# Patient Record
Sex: Female | Born: 2000 | Race: Black or African American | Hispanic: No | Marital: Single | Smoking: Never smoker
Health system: Southern US, Community
[De-identification: ages and names within clinical notes are randomized; demographics above are authoritative.]

## PROBLEM LIST (undated history)

## (undated) DIAGNOSIS — Z8489 Family history of other specified conditions: Secondary | ICD-10-CM

---
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
## 2018-06-10 ENCOUNTER — Other Ambulatory Visit: Payer: Self-pay | Admitting: Advanced Practice Midwife

## 2018-06-10 DIAGNOSIS — N6310 Unspecified lump in the right breast, unspecified quadrant: Secondary | ICD-10-CM

## 2018-06-21 ENCOUNTER — Other Ambulatory Visit: Payer: Self-pay | Admitting: Advanced Practice Midwife

## 2018-06-21 ENCOUNTER — Ambulatory Visit
Admission: RE | Admit: 2018-06-21 | Discharge: 2018-06-21 | Disposition: A | Discharge status: Home / Self Care | Payer: 59 | Source: Ambulatory Visit | Attending: Advanced Practice Midwife | Admitting: Advanced Practice Midwife

## 2018-06-21 DIAGNOSIS — N6310 Unspecified lump in the right breast, unspecified quadrant: Secondary | ICD-10-CM

## 2018-12-22 ENCOUNTER — Other Ambulatory Visit: Payer: 59

## 2019-03-29 ENCOUNTER — Ambulatory Visit
Admission: RE | Admit: 2019-03-29 | Discharge: 2019-03-29 | Disposition: A | Discharge status: Home / Self Care | Payer: BC Managed Care – PPO | Source: Ambulatory Visit | Attending: Advanced Practice Midwife | Admitting: Advanced Practice Midwife

## 2019-03-29 ENCOUNTER — Other Ambulatory Visit: Payer: Self-pay

## 2019-03-29 ENCOUNTER — Other Ambulatory Visit: Payer: Self-pay | Admitting: Advanced Practice Midwife

## 2019-03-29 DIAGNOSIS — N6310 Unspecified lump in the right breast, unspecified quadrant: Secondary | ICD-10-CM

## 2019-03-31 ENCOUNTER — Other Ambulatory Visit: Payer: Self-pay | Admitting: Advanced Practice Midwife

## 2019-03-31 ENCOUNTER — Ambulatory Visit
Admission: RE | Admit: 2019-03-31 | Discharge: 2019-03-31 | Disposition: A | Discharge status: Home / Self Care | Payer: BC Managed Care – PPO | Source: Ambulatory Visit | Attending: Advanced Practice Midwife | Admitting: Advanced Practice Midwife

## 2019-03-31 ENCOUNTER — Other Ambulatory Visit: Payer: Self-pay

## 2019-03-31 DIAGNOSIS — N6310 Unspecified lump in the right breast, unspecified quadrant: Secondary | ICD-10-CM

## 2019-04-11 ENCOUNTER — Ambulatory Visit: Payer: Self-pay | Admitting: Surgery

## 2019-04-11 DIAGNOSIS — N6310 Unspecified lump in the right breast, unspecified quadrant: Secondary | ICD-10-CM

## 2019-04-11 NOTE — H&P (Signed)
Mary Stout Documented: 04/11/2019 10:13 AM Location: [facility] Patient #: [id number] DOB: 24-Dec-2000 Single / Language: Cleophus Molt / Race: Black or African American Female  History of Present Illness Marcello Moores A. Kachina Niederer MD; 04/11/2019 10:32 AM) Patient words: Patient sent at the request of Dr. Dorise Bullion for enlarging right breast mass. The patient has been followed with ultrasound for a right breast mass o'clock. This had a large of her 6 month window and biopsy was performed. This showed a benign lesion but had areas of necrosis and given significant change in size over 6 months, she was referred for lumpectomy consideration. The patient denies breast pain, nipple discharge or significant change. She has no family history of breast disease             CLINICAL DATA: Six-month follow-up of right breast masses.  EXAM: ULTRASOUND OF THE RIGHT BREAST  COMPARISON: Previous exam(s).  FINDINGS: On physical exam, multiple lumps are again identified.  Targeted ultrasound is performed, showing a mass at 3 o'clock, 3 cm from the nipple measuring 3.6 x 3.4 x 4.2 cm today versus 2.8 x 2.0 x 2.9 cm previously. An adjacent mass at 3 o'clock, 3 cm from the nipple measures 8 by 5 x 5 mm today versus 10 by 4 x 5 mm previously, not significantly changed. A final mass at 3 o'clock, 3 cm from the nipple measures 8 by 6 x 5 mm today versus 7 by 7 x 4 mm previously, not significantly changed. No axillary adenopathy.  IMPRESSION: The largest mass is grown. The other 2 masses are stable and probably benign.  RECOMMENDATION: Recommend biopsy of the largest growing mass in the right breast.  I have discussed the findings and recommendations with the patient. Results were also provided in writing at the conclusion of the visit. If applicable, a reminder letter will be sent to the patient regarding the next appointment.  BI-RADS CATEGORY 4:  Suspicious.         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 SEE NOTE - NEGATIVE FOR MALIGNANCY.  The patient is a 18 year old female.   Past Surgical History Sabino Gasser, [place]; 04/11/2019 10:13 AM) No pertinent past surgical history  Diagnostic Studies History Sabino Gasser, [place]; 04/11/2019 10:13 AM) Colonoscopy never Mammogram never Pap Smear 1-5 years ago  Allergies Sabino Gasser, CMA; 04/11/2019 10:14 AM) No Known Drug Allergies [04/11/2019]: Allergies Reconciled  Medication History Sabino Gasser, CMA; 04/11/2019 10:14 AM) No Current Medications Medications Reconciled  Social History Sabino Gasser, [place]; 04/11/2019 10:13 AM) Alcohol use Occasional alcohol use. Caffeine use Coffee.  Pregnancy / Birth History Sabino Gasser, [place]; 04/11/2019 10:13 AM) Age at menarche 22 years. Regular periods  Other Problems Sabino Gasser, CMA; 04/11/2019 10:13 AM) No pertinent past medical history     Review of Systems Sabino Gasser CMA; 04/11/2019 10:13 AM) General Not Present- Appetite Loss, Chills, Fatigue, Fever, Night Sweats, Weight Gain and Weight Loss. HEENT Not Present- Earache, Hearing Loss, Hoarseness, Nose Bleed, Oral Ulcers, Ringing in the Ears, Seasonal Allergies, Sinus Pain, Sore Throat, Visual Disturbances, Wears glasses/contact lenses and Yellow Eyes. Respiratory Not Present- Bloody sputum, Chronic Cough, Difficulty Breathing, Snoring and Wheezing. Female Genitourinary Not Present- Frequency, Nocturia, Painful Urination, Pelvic Pain and Urgency. Musculoskeletal Not Present- Back Pain, Joint Pain, Joint Stiffness, Muscle Pain, Muscle Weakness and Swelling of Extremities. Neurological Not Present- Decreased Memory, Fainting, Headaches, Numbness, Seizures, Tingling, Tremor, Trouble walking  and Weakness.  Vitals Sabino Gasser CMA; 04/11/2019 10:14  AM) 04/11/2019 10:14 AM Weight: 174.38 lb Height: 66in Body Surface Area: 1.89 m Body Mass Index: 28.14 kg/m  Temp.: 98.36F(Oral)  Pulse: 100 (Regular)  BP: 112/62 (Sitting, Left Arm, Standard)        Physical Exam (Gertha Lichtenberg A. Green Quincy MD; 04/11/2019 10:32 AM)  General Mental Status-Alert. General Appearance-Consistent with stated age. Hydration-Well hydrated. Voice-Normal.  Head and Neck Head-normocephalic, atraumatic with no lesions or palpable masses. Trachea-midline. Thyroid Gland Characteristics - normal size and consistency.  Chest and Lung Exam Chest and lung exam reveals -quiet, even and easy respiratory effort with no use of accessory muscles and on auscultation, normal breath sounds, no adventitious sounds and normal vocal resonance. Inspection Chest Wall - Normal. Back - normal.  Breast Note: Right breast at 3:00 is a 3 cm mobile rubbery mass. Breasts are both very dense. No other mass is palpable.  Cardiovascular Cardiovascular examination reveals -normal heart sounds, regular rate and rhythm with no murmurs and normal pedal pulses bilaterally.  Neurologic Neurologic evaluation reveals -alert and oriented x 3 with no impairment of recent or remote memory. Mental Status-Normal.  Lymphatic Axillary  General Axillary Region: Bilateral - Description - Normal. Tenderness - Non Tender.    Assessment & Plan (Jefferey Lippmann A. Marabelle Cushman MD; 04/11/2019 10:33 AM)  LARGE MASS OF RIGHT BREAST (N63.10) Impression: Given significant change over 6 months, I recommend right breast lumpectomy. Risks, benefits and other treatment options discussed. Potential cosmetic issues discussed. Nonoperative options discussed. Risk of lumpectomy include bleeding, infection, seroma, more surgery, use of seed/wire, wound care, cosmetic deformity and the need for other treatments, death , blood clots, death. Pt agrees to proceed. COVID SCREENING RISK  DISCUSSED  Current Plans Pt Education - [facility] Breast Biopsy HCI: discussed with patient and provided information. You are being scheduled for surgery- Our schedulers will call you.  You should hear from our office's scheduling department within 5 working days about the location, date, and time of surgery. We try to make accommodations for patient's preferences in scheduling surgery, but sometimes the OR schedule or the surgeon's schedule prevents [place] from making those accommodations.  If you have not heard from our office [phone]) in 5 working days, call the office and ask for your surgeon's nurse.  If you have other questions about your diagnosis, plan, or surgery, call the office and ask for your surgeon's nurse.  Pt Education - Pamphlet Given - Breast Biopsy: discussed with patient and provided information.

## 2019-05-04 ENCOUNTER — Other Ambulatory Visit: Payer: Self-pay | Admitting: Surgery

## 2019-05-04 DIAGNOSIS — N6310 Unspecified lump in the right breast, unspecified quadrant: Secondary | ICD-10-CM

## 2019-05-26 ENCOUNTER — Other Ambulatory Visit: Payer: Self-pay

## 2019-05-26 ENCOUNTER — Encounter (HOSPITAL_BASED_OUTPATIENT_CLINIC_OR_DEPARTMENT_OTHER): Payer: Self-pay

## 2019-05-30 ENCOUNTER — Other Ambulatory Visit (HOSPITAL_COMMUNITY)
Admission: RE | Admit: 2019-05-30 | Discharge: 2019-05-30 | Disposition: A | Discharge status: Home / Self Care | Payer: BC Managed Care – PPO | Source: Ambulatory Visit | Attending: Surgery | Admitting: Surgery

## 2019-05-30 DIAGNOSIS — Z01812 Encounter for preprocedural laboratory examination: Secondary | ICD-10-CM | POA: Diagnosis present

## 2019-05-30 DIAGNOSIS — Z20828 Contact with and (suspected) exposure to other viral communicable diseases: Secondary | ICD-10-CM | POA: Diagnosis not present

## 2019-05-30 LAB — SARS CORONAVIRUS 2 (TAT 6-24 HRS): SARS Coronavirus 2: NEGATIVE

## 2019-06-01 ENCOUNTER — Other Ambulatory Visit: Payer: Self-pay

## 2019-06-01 ENCOUNTER — Ambulatory Visit
Admission: RE | Admit: 2019-06-01 | Discharge: 2019-06-01 | Disposition: A | Discharge status: Home / Self Care | Payer: BC Managed Care – PPO | Source: Ambulatory Visit | Attending: Surgery | Admitting: Surgery

## 2019-06-01 DIAGNOSIS — N6310 Unspecified lump in the right breast, unspecified quadrant: Secondary | ICD-10-CM

## 2019-06-02 ENCOUNTER — Encounter (HOSPITAL_BASED_OUTPATIENT_CLINIC_OR_DEPARTMENT_OTHER)
Admission: RE | Disposition: A | Discharge status: Home / Self Care | Payer: Self-pay | Source: Home / Self Care | Attending: Surgery

## 2019-06-02 ENCOUNTER — Encounter (HOSPITAL_BASED_OUTPATIENT_CLINIC_OR_DEPARTMENT_OTHER): Payer: Self-pay | Admitting: *Deleted

## 2019-06-02 ENCOUNTER — Other Ambulatory Visit: Payer: Self-pay

## 2019-06-02 ENCOUNTER — Ambulatory Visit (HOSPITAL_BASED_OUTPATIENT_CLINIC_OR_DEPARTMENT_OTHER)
Admission: RE | Admit: 2019-06-02 | Discharge: 2019-06-02 | Disposition: A | Discharge status: Home / Self Care | Payer: BC Managed Care – PPO | Attending: Surgery | Admitting: Surgery

## 2019-06-02 ENCOUNTER — Ambulatory Visit
Admission: RE | Admit: 2019-06-02 | Discharge: 2019-06-02 | Disposition: A | Discharge status: Home / Self Care | Payer: BC Managed Care – PPO | Source: Ambulatory Visit | Attending: Surgery | Admitting: Surgery

## 2019-06-02 ENCOUNTER — Ambulatory Visit (HOSPITAL_BASED_OUTPATIENT_CLINIC_OR_DEPARTMENT_OTHER): Payer: BC Managed Care – PPO | Admitting: Certified Registered"

## 2019-06-02 DIAGNOSIS — N6310 Unspecified lump in the right breast, unspecified quadrant: Secondary | ICD-10-CM | POA: Diagnosis present

## 2019-06-02 DIAGNOSIS — D241 Benign neoplasm of right breast: Secondary | ICD-10-CM | POA: Diagnosis not present

## 2019-06-02 HISTORY — DX: Family history of other specified conditions: Z84.89

## 2019-06-02 HISTORY — PX: BREAST LUMPECTOMY WITH RADIOACTIVE SEED LOCALIZATION: SHX6424

## 2019-06-02 LAB — POCT PREGNANCY, URINE: Preg Test, Ur: NEGATIVE

## 2019-06-02 SURGERY — BREAST LUMPECTOMY WITH RADIOACTIVE SEED LOCALIZATION
Anesthesia: General | Site: Breast | Laterality: Right

## 2019-06-02 MED ORDER — ONDANSETRON HCL 4 MG/2ML IJ SOLN: 4.0000 mg | Freq: Once | INTRAMUSCULAR | Status: DC | PRN

## 2019-06-02 MED ORDER — BUPIVACAINE-EPINEPHRINE (PF) 0.25% -1:200000 IJ SOLN
INTRAMUSCULAR | Status: DC | PRN
  Administered 2019-06-02: 20 mL

## 2019-06-02 MED ORDER — LIDOCAINE 2% (20 MG/ML) 5 ML SYRINGE
INTRAMUSCULAR | Status: DC | PRN
  Administered 2019-06-02: 80 mg via INTRAVENOUS

## 2019-06-02 MED ORDER — OXYCODONE HCL 5 MG/5ML PO SOLN: 5.0000 mg | Freq: Once | ORAL | Status: DC | PRN

## 2019-06-02 MED ORDER — CEFAZOLIN SODIUM-DEXTROSE 2-4 GM/100ML-% IV SOLN
2.0000 g | INTRAVENOUS | Status: AC
  Administered 2019-06-02: 2 g via INTRAVENOUS

## 2019-06-02 MED ORDER — HYDROMORPHONE HCL 1 MG/ML IJ SOLN: 0.2500 mg | INTRAMUSCULAR | Status: DC | PRN

## 2019-06-02 MED ORDER — CHLORHEXIDINE GLUCONATE CLOTH 2 % EX PADS: 6.0000 | MEDICATED_PAD | Freq: Once | CUTANEOUS | Status: DC

## 2019-06-02 MED ORDER — GABAPENTIN 300 MG PO CAPS
ORAL_CAPSULE | ORAL | Status: AC
  Filled 2019-06-02: qty 1

## 2019-06-02 MED ORDER — DEXAMETHASONE SODIUM PHOSPHATE 10 MG/ML IJ SOLN
INTRAMUSCULAR | Status: DC | PRN
  Administered 2019-06-02: 4 mg via INTRAVENOUS

## 2019-06-02 MED ORDER — MIDAZOLAM HCL 2 MG/2ML IJ SOLN
1.0000 mg | INTRAMUSCULAR | Status: DC | PRN
  Administered 2019-06-02: 2 mg via INTRAVENOUS

## 2019-06-02 MED ORDER — PROPOFOL 10 MG/ML IV BOLUS
INTRAVENOUS | Status: AC
  Filled 2019-06-02: qty 20

## 2019-06-02 MED ORDER — OXYCODONE HCL 5 MG PO TABS: 5.0000 mg | ORAL_TABLET | Freq: Once | ORAL | Status: DC | PRN

## 2019-06-02 MED ORDER — ACETAMINOPHEN 500 MG PO TABS
ORAL_TABLET | ORAL | Status: AC
  Filled 2019-06-02: qty 2

## 2019-06-02 MED ORDER — SCOPOLAMINE 1 MG/3DAYS TD PT72: 1.0000 | MEDICATED_PATCH | Freq: Once | TRANSDERMAL | Status: DC

## 2019-06-02 MED ORDER — CEFAZOLIN SODIUM-DEXTROSE 2-4 GM/100ML-% IV SOLN
INTRAVENOUS | Status: AC
  Filled 2019-06-02: qty 100

## 2019-06-02 MED ORDER — CELECOXIB 200 MG PO CAPS
ORAL_CAPSULE | ORAL | Status: AC
  Filled 2019-06-02: qty 1

## 2019-06-02 MED ORDER — ONDANSETRON HCL 4 MG/2ML IJ SOLN
INTRAMUSCULAR | Status: DC | PRN
  Administered 2019-06-02: 4 mg via INTRAVENOUS

## 2019-06-02 MED ORDER — FENTANYL CITRATE (PF) 100 MCG/2ML IJ SOLN
INTRAMUSCULAR | Status: AC
  Filled 2019-06-02: qty 2

## 2019-06-02 MED ORDER — EPHEDRINE SULFATE 50 MG/ML IJ SOLN
INTRAMUSCULAR | Status: DC | PRN
  Administered 2019-06-02: 5 mg via INTRAVENOUS

## 2019-06-02 MED ORDER — FENTANYL CITRATE (PF) 100 MCG/2ML IJ SOLN
50.0000 ug | INTRAMUSCULAR | Status: AC | PRN
  Administered 2019-06-02 (×4): 25 ug via INTRAVENOUS

## 2019-06-02 MED ORDER — ACETAMINOPHEN 500 MG PO TABS
1000.0000 mg | ORAL_TABLET | ORAL | Status: AC
  Administered 2019-06-02: 07:00:00 1000 mg via ORAL

## 2019-06-02 MED ORDER — LACTATED RINGERS IV SOLN
INTRAVENOUS | Status: DC
  Administered 2019-06-02 (×2): via INTRAVENOUS

## 2019-06-02 MED ORDER — MEPERIDINE HCL 25 MG/ML IJ SOLN: 6.2500 mg | INTRAMUSCULAR | Status: DC | PRN

## 2019-06-02 MED ORDER — MIDAZOLAM HCL 2 MG/2ML IJ SOLN
INTRAMUSCULAR | Status: AC
  Filled 2019-06-02: qty 2

## 2019-06-02 MED ORDER — PROPOFOL 10 MG/ML IV BOLUS
INTRAVENOUS | Status: DC | PRN
  Administered 2019-06-02: 200 mg via INTRAVENOUS
  Administered 2019-06-02: 50 mg via INTRAVENOUS
  Administered 2019-06-02: 30 mg via INTRAVENOUS

## 2019-06-02 MED ORDER — IBUPROFEN 800 MG PO TABS: 800.0000 mg | ORAL_TABLET | Freq: Three times a day (TID) | ORAL | 0 refills | Status: AC | PRN

## 2019-06-02 MED ORDER — GABAPENTIN 300 MG PO CAPS
300.0000 mg | ORAL_CAPSULE | ORAL | Status: AC
  Administered 2019-06-02: 300 mg via ORAL

## 2019-06-02 MED ORDER — CELECOXIB 200 MG PO CAPS
200.0000 mg | ORAL_CAPSULE | ORAL | Status: AC
  Administered 2019-06-02: 200 mg via ORAL

## 2019-06-02 MED ORDER — HYDROCODONE-ACETAMINOPHEN 5-325 MG PO TABS: 1.0000 | ORAL_TABLET | Freq: Four times a day (QID) | ORAL | 0 refills | Status: AC | PRN

## 2019-06-02 SURGICAL SUPPLY — 46 items
APPLIER CLIP 9.375 MED OPEN (MISCELLANEOUS)
BINDER BREAST LRG (GAUZE/BANDAGES/DRESSINGS) ×3 IMPLANT
BINDER BREAST XLRG (GAUZE/BANDAGES/DRESSINGS) IMPLANT
BLADE SURG 15 STRL LF DISP TIS (BLADE) ×1 IMPLANT
BLADE SURG 15 STRL SS (BLADE) ×2
CANISTER SUC SOCK COL 7IN (MISCELLANEOUS) IMPLANT
CANISTER SUCT 1200ML W/VALVE (MISCELLANEOUS) IMPLANT
CHLORAPREP W/TINT 26 (MISCELLANEOUS) ×3 IMPLANT
CLIP APPLIE 9.375 MED OPEN (MISCELLANEOUS) IMPLANT
COVER BACK TABLE REUSABLE LG (DRAPES) ×3 IMPLANT
COVER MAYO STAND REUSABLE (DRAPES) ×3 IMPLANT
COVER PROBE W GEL 5X96 (DRAPES) ×3 IMPLANT
DECANTER SPIKE VIAL GLASS SM (MISCELLANEOUS) IMPLANT
DERMABOND ADVANCED (GAUZE/BANDAGES/DRESSINGS) ×2
DERMABOND ADVANCED .7 DNX12 (GAUZE/BANDAGES/DRESSINGS) ×1 IMPLANT
DRAPE LAPAROTOMY 100X72 PEDS (DRAPES) ×3 IMPLANT
DRAPE UTILITY XL STRL (DRAPES) ×3 IMPLANT
ELECT COATED BLADE 2.86 ST (ELECTRODE) ×3 IMPLANT
ELECT REM PT RETURN 9FT ADLT (ELECTROSURGICAL) ×3
ELECTRODE REM PT RTRN 9FT ADLT (ELECTROSURGICAL) ×1 IMPLANT
GLOVE BIO SURGEON STRL SZ7 (GLOVE) ×3 IMPLANT
GLOVE BIOGEL PI IND STRL 7.0 (GLOVE) ×2 IMPLANT
GLOVE BIOGEL PI IND STRL 8 (GLOVE) ×1 IMPLANT
GLOVE BIOGEL PI INDICATOR 7.0 (GLOVE) ×4
GLOVE BIOGEL PI INDICATOR 8 (GLOVE) ×2
GLOVE ECLIPSE 8.0 STRL XLNG CF (GLOVE) ×3 IMPLANT
GOWN STRL REUS W/ TWL LRG LVL3 (GOWN DISPOSABLE) ×2 IMPLANT
GOWN STRL REUS W/TWL LRG LVL3 (GOWN DISPOSABLE) ×4
HEMOSTAT ARISTA ABSORB 3G PWDR (HEMOSTASIS) IMPLANT
HEMOSTAT SNOW SURGICEL 2X4 (HEMOSTASIS) IMPLANT
KIT MARKER MARGIN INK (KITS) ×3 IMPLANT
NEEDLE HYPO 25X1 1.5 SAFETY (NEEDLE) ×3 IMPLANT
NS IRRIG 1000ML POUR BTL (IV SOLUTION) ×3 IMPLANT
PACK BASIN DAY SURGERY FS (CUSTOM PROCEDURE TRAY) ×3 IMPLANT
PENCIL BUTTON HOLSTER BLD 10FT (ELECTRODE) ×3 IMPLANT
SLEEVE SCD COMPRESS KNEE MED (MISCELLANEOUS) ×3 IMPLANT
SPONGE LAP 4X18 RFD (DISPOSABLE) ×3 IMPLANT
SUT MNCRL AB 4-0 PS2 18 (SUTURE) ×3 IMPLANT
SUT SILK 2 0 SH (SUTURE) IMPLANT
SUT VICRYL 3-0 CR8 SH (SUTURE) ×3 IMPLANT
SYR CONTROL 10ML LL (SYRINGE) ×3 IMPLANT
TOWEL GREEN STERILE FF (TOWEL DISPOSABLE) ×3 IMPLANT
TRAY FAXITRON CT DISP (TRAY / TRAY PROCEDURE) ×3 IMPLANT
TUBE CONNECTING 20'X1/4 (TUBING)
TUBE CONNECTING 20X1/4 (TUBING) IMPLANT
YANKAUER SUCT BULB TIP NO VENT (SUCTIONS) IMPLANT

## 2019-06-02 NOTE — Anesthesia Preprocedure Evaluation (Signed)
Anesthesia Evaluation  Patient identified by MRN, date of birth, ID band Patient awake    Reviewed: Allergy & Precautions, NPO status , Patient's Chart, lab work & pertinent test results  History of Anesthesia Complications (+) Family history of anesthesia reaction  Airway Mallampati: II  TM Distance: >3 FB Neck ROM: Full    Dental no notable dental hx. (+) Teeth Intact   Pulmonary neg pulmonary ROS,    Pulmonary exam normal breath sounds clear to auscultation       Cardiovascular negative cardio ROS Normal cardiovascular exam Rhythm:Regular Rate:Normal     Neuro/Psych negative neurological ROS  negative psych ROS   GI/Hepatic negative GI ROS, Neg liver ROS,   Endo/Other  Right Breast Mass  Renal/GU negative Renal ROS  negative genitourinary   Musculoskeletal negative musculoskeletal ROS (+)   Abdominal   Peds  Hematology negative hematology ROS (+)   Anesthesia Other Findings   Reproductive/Obstetrics                             Anesthesia Physical Anesthesia Plan  ASA: II  Anesthesia Plan: General   Post-op Pain Management:    Induction: Intravenous  PONV Risk Score and Plan: 3 and Ondansetron, Dexamethasone, Scopolamine patch - Pre-op, Midazolam and Treatment may vary due to age or medical condition  Airway Management Planned: LMA  Additional Equipment:   Intra-op Plan:   Post-operative Plan: Extubation in OR  Informed Consent: I have reviewed the patients History and Physical, chart, labs and discussed the procedure including the risks, benefits and alternatives for the proposed anesthesia with the patient or authorized representative who has indicated his/her understanding and acceptance.     Dental advisory given  Plan Discussed with: CRNA and Surgeon  Anesthesia Plan Comments:         Anesthesia Quick Evaluation

## 2019-06-02 NOTE — Discharge Instructions (Signed)
[facility] °Office Phone Number [phone] ° °BREAST BIOPSY/ PARTIAL MASTECTOMY: POST OP INSTRUCTIONS ° °Always review your discharge instruction sheet given to you by the facility where your surgery was performed. ° °IF YOU HAVE DISABILITY OR FAMILY LEAVE FORMS, YOU MUST BRING THEM TO THE OFFICE FOR PROCESSING.  DO NOT GIVE THEM TO YOUR DOCTOR. ° °1. A prescription for pain medication may be given to you upon discharge.  Take your pain medication as prescribed, if needed.  If narcotic pain medicine is not needed, then you may take acetaminophen (Tylenol) or ibuprofen (Advil) as needed. °2. Take your usually prescribed medications unless otherwise directed °3. If you need a refill on your pain medication, please contact your pharmacy.  They will contact our office to request authorization.  Prescriptions will not be filled after 5pm or on week-ends. °4. You should eat very light the first 24 hours after surgery, such as soup, crackers, pudding, etc.  Resume your normal diet the day after surgery. °5. Most patients will experience some swelling and bruising in the breast.  Ice packs and a good support bra will help.  Swelling and bruising can take several days to resolve.  °6. It is common to experience some constipation if taking pain medication after surgery.  Increasing fluid intake and taking a stool softener will usually help or prevent this problem from occurring.  A mild laxative (Milk of Magnesia or Miralax) should be taken according to package directions if there are no bowel movements after 48 hours. °7. Unless discharge instructions indicate otherwise, you may remove your bandages 24-48 hours after surgery, and you may shower at that time.  You may have steri-strips (small skin tapes) in place directly over the incision.  These strips should be left on the skin for 7-10 days.  If your surgeon used skin glue on the incision, you may shower in 24 hours.  The glue will flake off over the  next 2-3 weeks.  Any sutures or staples will be removed at the office during your follow-up visit. °8. ACTIVITIES:  You may resume regular daily activities (gradually increasing) beginning the next day.  Wearing a good support bra or sports bra minimizes pain and swelling.  You may have sexual intercourse when it is comfortable. °a. You may drive when you no longer are taking prescription pain medication, you can comfortably wear a seatbelt, and you can safely maneuver your car and apply brakes. °b. RETURN TO WORK:  ______________________________________________________________________________________ °9. You should see your doctor in the office for a follow-up appointment approximately two weeks after your surgery.  Your doctor’s nurse will typically make your follow-up appointment when she calls you with your pathology report.  Expect your pathology report 2-3 business days after your surgery.  You may call to check if you do not hear from us after three days. °10. OTHER INSTRUCTIONS: _______________________________________________________________________________________________ _____________________________________________________________________________________________________________________________________ °_____________________________________________________________________________________________________________________________________ °_____________________________________________________________________________________________________________________________________ ° °WHEN TO CALL YOUR DOCTOR: °1. Fever over 101.0 °2. Nausea and/or vomiting. °3. Extreme swelling or bruising. °4. Continued bleeding from incision. °5. Increased pain, redness, or drainage from the incision. ° °The clinic staff is available to answer your questions during regular business hours.  Please don’t hesitate to call and ask to speak to one of the nurses for clinical concerns.  If you have a medical emergency, go to the nearest  emergency room or call 911.  A surgeon from [facility] is always on call at the hospital. ° °For further questions, please visit [email or web address]  ° ° ° °  May take next dose of Tylenol at 1:30 PM on 06/02/2019 if needed. May take next dose of Ibuprofen at 3:30 PM on 06/02/2019 if needed.   Post Anesthesia Home Care Instructions  Activity: Get plenty of rest for the remainder of the day. A responsible individual must stay with you for 24 hours following the procedure.  For the next 24 hours, DO NOT: -Drive a car -Paediatric nurse -Drink alcoholic beverages -Take any medication unless instructed by your physician -Make any legal decisions or sign important papers.  Meals: Start with liquid foods such as gelatin or soup. Progress to regular foods as tolerated. Avoid greasy, spicy, heavy foods. If nausea and/or vomiting occur, drink only clear liquids until the nausea and/or vomiting subsides. Call your physician if vomiting continues.  Special Instructions/Symptoms: Your throat may feel dry or sore from the anesthesia or the breathing tube placed in your throat during surgery. If this causes discomfort, gargle with warm salt water. The discomfort should disappear within 24 hours.  If you had a scopolamine patch placed behind your ear for the management of post- operative nausea and/or vomiting:  1. The medication in the patch is effective for 72 hours, after which it should be removed.  Wrap patch in a tissue and discard in the trash. Wash hands thoroughly with soap and water. 2. You may remove the patch earlier than 72 hours if you experience unpleasant side effects which may include dry mouth, dizziness or visual disturbances. 3. Avoid touching the patch. Wash your hands with soap and water after contact with the patch.

## 2019-06-02 NOTE — Anesthesia Postprocedure Evaluation (Signed)
Anesthesia Post Note  Patient: Mary Stout  Procedure(s) Performed: RIGHT BREAST LUMPECTOMY WITH RADIOACTIVE SEED LOCALIZATION (Right Breast)     Patient location during evaluation: PACU Anesthesia Type: General Level of consciousness: awake and alert and oriented Pain management: pain level controlled Vital Signs Assessment: post-procedure vital signs reviewed and stable Respiratory status: spontaneous breathing, nonlabored ventilation and respiratory function stable Cardiovascular status: blood pressure returned to baseline and stable Postop Assessment: no apparent nausea or vomiting Anesthetic complications: no    Last Vitals:  Vitals:   06/02/19 1015 06/02/19 1029  BP: 103/67 106/68  Pulse: 93 88  Resp: 19 20  Temp:  36.9 C  SpO2: 100% 98%    Last Pain:  Vitals:   06/02/19 1029  TempSrc:   PainSc: 1                  Cynithia Hakimi A.

## 2019-06-02 NOTE — Op Note (Signed)
Preoperative diagnosis: Right breast mass  Postoperative diagnosis: Same  Procedure: Right breast seed localized lumpectomy  Surgeon: Erroll Luna, MD  Anesthesia: LMA with local  EBL: Minimal  Specimen: Right breast mass with seed and clip verified by Faxitron  Drains: None  IV fluids: Per anesthesia record  Indications for procedure: The patient presents for right breast lumpectomy for enlarging right breast mass.  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  That was felt to be significant.  Recommend a right breast lumpectomy to remove this to exclude any malignant variant as well as prevent future increases in size especially if she were to get pregnant.The procedure has been discussed with the patient. Alternatives to surgery have been discussed with the patient.  Risks of surgery include bleeding,  Infection,  Seroma formation, death,  and the need for further surgery.   The patient understands and wishes to proceed.  Description of procedure: The patient was met in the holding area and questions were answered.  Neoprobe used to verify seed location in the right breast and this was marked as the correct side.  She was then taken back to the operative room placed supine upon the OR table.  After induction of general anesthesia, the right breast was prepped and draped in sterile fashion and timeout was performed.  Curvilinear incision made along the lateral border of the nipple areolar complex adjacent to the mass with the help of the neoprobe.  All tissue around the mass was excised with grossly negative margins.  Hemostasis achieved and Faxitron revealed the seed and clip to be in the specimen.  Wound was then closed with 3-0 Vicryl and 4-0 Monocryl.  Dermabond applied.  All final counts found to be correct of sponge, needles and instruments.  Patient was awoke extubated and sent to recovery in satisfactory condition.

## 2019-06-02 NOTE — H&P (Signed)
Mary Stout  Location: [facility] Patient #: [id number] DOB: 08-Feb-2001 Single / Language: Cleophus Molt / Race: Black or African American Female  History of Present Illness  Patient words: Patient sent at the request of Dr. Dorise Bullion for enlarging right breast mass. The patient has been followed with ultrasound . This had  enlarged over  6 month window and biopsy was performed. This showed a benign lesion but had areas of necrosis. Given significant change in size over 6 months, she was referred for lumpectomy consideration. The patient denies breast pain, nipple discharge or significant change. She has no family history of breast disease             CLINICAL DATA: Six-month follow-up of right breast masses.  EXAM: ULTRASOUND OF THE RIGHT BREAST  COMPARISON: Previous exam(s).  FINDINGS: On physical exam, multiple lumps are again identified.  Targeted ultrasound is performed, showing a mass at 3 o'clock, 3 cm from the nipple measuring 3.6 x 3.4 x 4.2 cm today versus 2.8 x 2.0 x 2.9 cm previously. An adjacent mass at 3 o'clock, 3 cm from the nipple measures 8 by 5 x 5 mm today versus 10 by 4 x 5 mm previously, not significantly changed. A final mass at 3 o'clock, 3 cm from the nipple measures 8 by 6 x 5 mm today versus 7 by 7 x 4 mm previously, not significantly changed. No axillary adenopathy.  IMPRESSION: The largest mass is grown. The other 2 masses are stable and probably benign.  RECOMMENDATION: Recommend biopsy of the largest growing mass in the right breast.  I have discussed the findings and recommendations with the patient. Results were also provided in writing at the conclusion of the visit. If applicable, a reminder letter will be sent to the patient regarding the next appointment.  BI-RADS CATEGORY 4: Suspicious.         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 SEE NOTE - NEGATIVE FOR MALIGNANCY.  The patient is a 18 year old female.   Past Surgical History  No pertinent past surgical history  Diagnostic Studies History  Colonoscopy never Mammogram never Pap Smear 1-5 years ago  Allergies  No Known Drug Allergies  Allergies Reconciled  Medication History  No Current Medications Medications Reconciled  Social History  Alcohol use Occasional alcohol use. Caffeine use Coffee.  Pregnancy / Birth History  Age at menarche 67 years. Regular periods  Other Problems  No pertinent past medical history     Review of Systems  General Not Present- Appetite Loss, Chills, Fatigue, Fever, Night Sweats, Weight Gain and Weight Loss. HEENT Not Present- Earache, Hearing Loss, Hoarseness, Nose Bleed, Oral Ulcers, Ringing in the Ears, Seasonal Allergies, Sinus Pain, Sore Throat, Visual Disturbances, Wears glasses/contact lenses and Yellow Eyes. Respiratory Not Present- Bloody sputum, Chronic Cough, Difficulty Breathing, Snoring and Wheezing. Female Genitourinary Not Present- Frequency, Nocturia, Painful Urination, Pelvic Pain and Urgency. Musculoskeletal Not Present- Back Pain, Joint Pain, Joint Stiffness, Muscle Pain, Muscle Weakness and Swelling of Extremities. Neurological Not Present- Decreased Memory, Fainting, Headaches, Numbness, Seizures, Tingling, Tremor, Trouble walking and Weakness.  Vitals  04/11/2019 10:14 AM Weight: 174.38 lb Height: 66in Body Surface Area: 1.89 m Body Mass Index: 28.14 kg/m  Temp.: 98.29F(Oral)  Pulse: 100 (Regular)  BP: 112/62 (Sitting, Left Arm, Standard)        Physical Exam   General Mental Status-Alert. General Appearance-Consistent with stated  age. Hydration-Well hydrated. Voice-Normal.  Head and Neck Head-normocephalic, atraumatic with no  lesions or palpable masses. Trachea-midline. Thyroid Gland Characteristics - normal size and consistency.  Chest and Lung Exam Chest and lung exam reveals -quiet, even and easy respiratory effort with no use of accessory muscles and on auscultation, normal breath sounds, no adventitious sounds and normal vocal resonance. Inspection Chest Wall - Normal. Back - normal.  Breast Note: Right breast at 3:00 is a 3 cm mobile rubbery mass. Breasts are both very dense. No other mass is palpable.  Cardiovascular Cardiovascular examination reveals -normal heart sounds, regular rate and rhythm with no murmurs and normal pedal pulses bilaterally.  Neurologic Neurologic evaluation reveals -alert and oriented x 3 with no impairment of recent or remote memory. Mental Status-Normal.  Lymphatic Axillary  General Axillary Region: Bilateral - Description - Normal. Tenderness - Non Tender.    Assessment & Plan   LARGE MASS OF RIGHT BREAST (N63.10) Impression: Given significant change over 6 months, I recommend right breast lumpectomy. Risks, benefits and other treatment options discussed. Potential cosmetic issues discussed. Nonoperative options discussed. Risk of lumpectomy include bleeding, infection, seroma, more surgery, use of seed/wire, wound care, cosmetic deformity and the need for other treatments, death , blood clots, death. Pt agrees to proceed. COVID SCREENING RISK DISCUSSED  Current Plans Pt Education - [facility] Breast Biopsy HCI: discussed with patient and provided information. You are being scheduled for surgery- Our schedulers will call you.  You should hear from our office's scheduling department within 5 working days about the location, date, and time of surgery. We try to make accommodations for patient's preferences in scheduling surgery, but sometimes the OR schedule or the surgeon's schedule prevents [place] from making those accommodations.  If you have not  heard from our office [phone]) in 5 working days, call the office and ask for your surgeon's nurse.  If you have other questions about your diagnosis, plan, or surgery, call the office and ask for your surgeon's nurse.  Pt Education - Pamphlet Given - Breast Biopsy: discussed with patient and provided information.

## 2019-06-02 NOTE — Interval H&P Note (Signed)
History and Physical Interval Note:  06/02/2019 8:25 AM  Mary Stout  has presented today for surgery, with the diagnosis of RIGHT BREAST MASS.  The various methods of treatment have been discussed with the patient and family. After consideration of risks, benefits and other options for treatment, the patient has consented to  Procedure(s): RIGHT BREAST LUMPECTOMY WITH RADIOACTIVE SEED LOCALIZATION (Right) as a surgical intervention.  The patient's history has been reviewed, patient examined, no change in status, stable for surgery.  I have reviewed the patient's chart and labs.  Questions were answered to the patient's satisfaction.     [place]

## 2019-06-02 NOTE — Transfer of Care (Signed)
Immediate Anesthesia Transfer of Care Note  Patient: Mary Stout  Procedure(s) Performed: RIGHT BREAST LUMPECTOMY WITH RADIOACTIVE SEED LOCALIZATION (Right Breast)  Patient Location: PACU  Anesthesia Type:General  Level of Consciousness: drowsy  Airway & Oxygen Therapy: Patient Spontanous Breathing and Patient connected to face mask oxygen  Post-op Assessment: Report given to RN and Post -op Vital signs reviewed and stable  Post vital signs: Reviewed and stable  Last Vitals:  Vitals Value Taken Time  BP 100/53 06/02/19 1000  Temp    Pulse 101 06/02/19 1001  Resp 16 06/02/19 1001  SpO2 99 % 06/02/19 1001  Vitals shown include unvalidated device data.  Last Pain:  Vitals:   06/02/19 0709  TempSrc: Oral  PainSc: 0-No pain         Complications: No apparent anesthesia complications

## 2019-06-02 NOTE — Anesthesia Procedure Notes (Signed)
Procedure Name: LMA Insertion Date/Time: 06/02/2019 8:53 AM Performed by: Lavonia Dana, CRNA Pre-anesthesia Checklist: Patient identified, Emergency Drugs available, Suction available and Patient being monitored Patient Re-evaluated:Patient Re-evaluated prior to induction Oxygen Delivery Method: Circle system utilized Preoxygenation: Pre-oxygenation with 100% oxygen Induction Type: IV induction Ventilation: Mask ventilation without difficulty LMA: LMA inserted LMA Size: 4.0 Number of attempts: 1 Airway Equipment and Method: Bite block Placement Confirmation: positive ETCO2 Tube secured with: Tape Dental Injury: Teeth and Oropharynx as per pre-operative assessment

## 2019-06-03 ENCOUNTER — Encounter (HOSPITAL_BASED_OUTPATIENT_CLINIC_OR_DEPARTMENT_OTHER): Payer: Self-pay | Admitting: Surgery

## 2019-06-06 LAB — SURGICAL PATHOLOGY

## 2019-06-25 IMAGING — US ULTRASOUND RIGHT BREAST LIMITED
1 series · 11 of 11 positions shown · non-contrast
Comparison: None.

CLINICAL DATA: 17-year-old female presenting for evaluation of a
palpable lump in the right breast.

EXAM:
ULTRASOUND OF THE RIGHT BREAST

[Series 1: ultrasound right breast limited · 0.07mm/px · 11 of 11 slices shown]
[im 1/11]
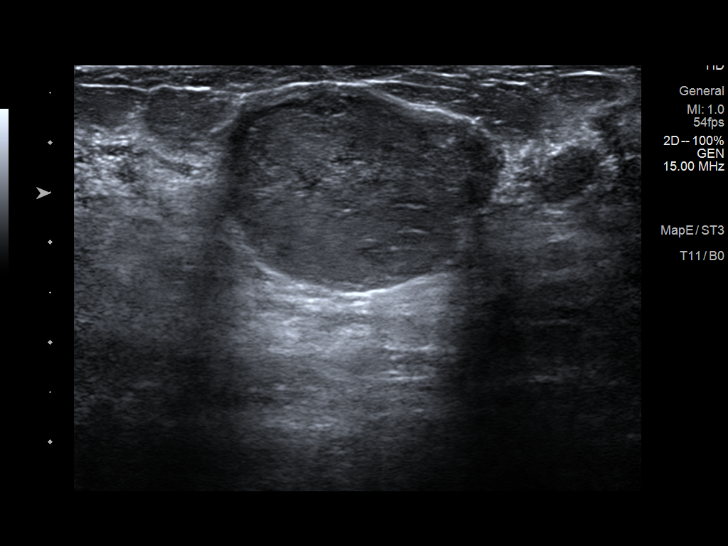
[im 2/11]
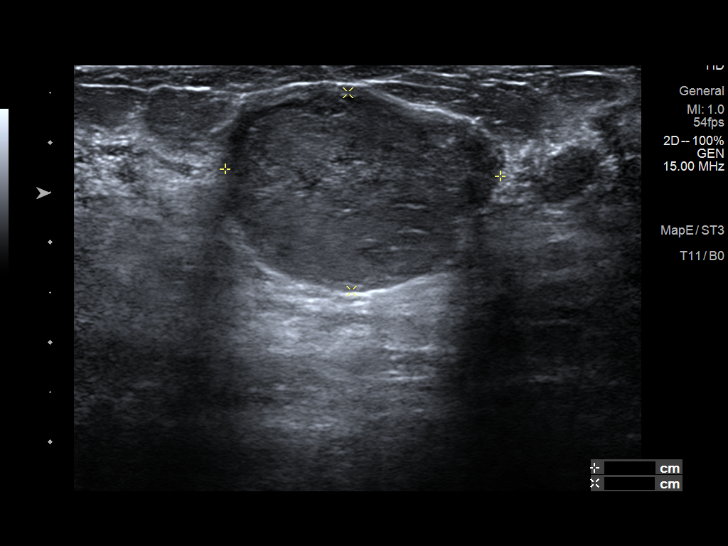
[im 3/11]
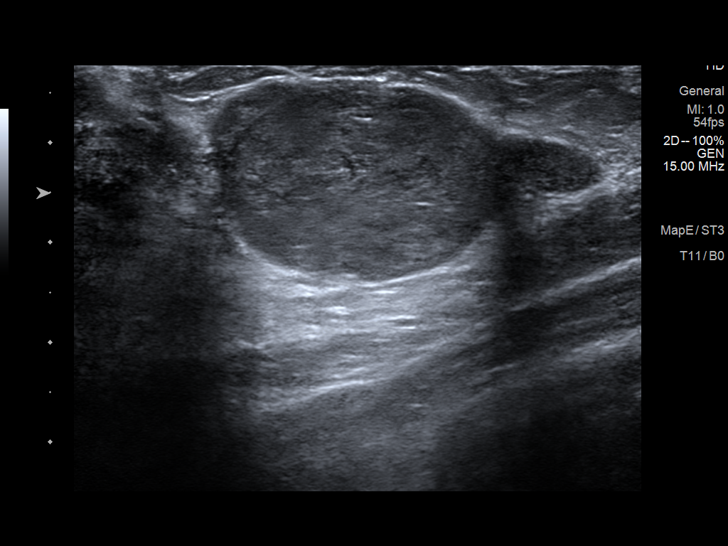
[im 4/11]
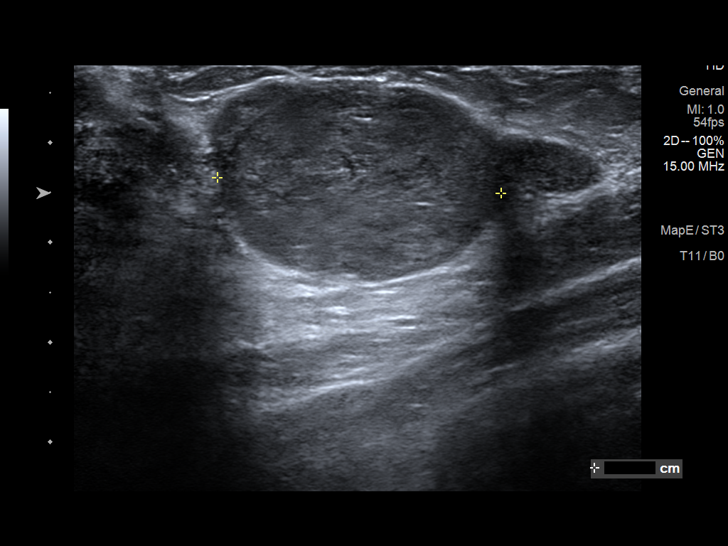
[im 5/11]
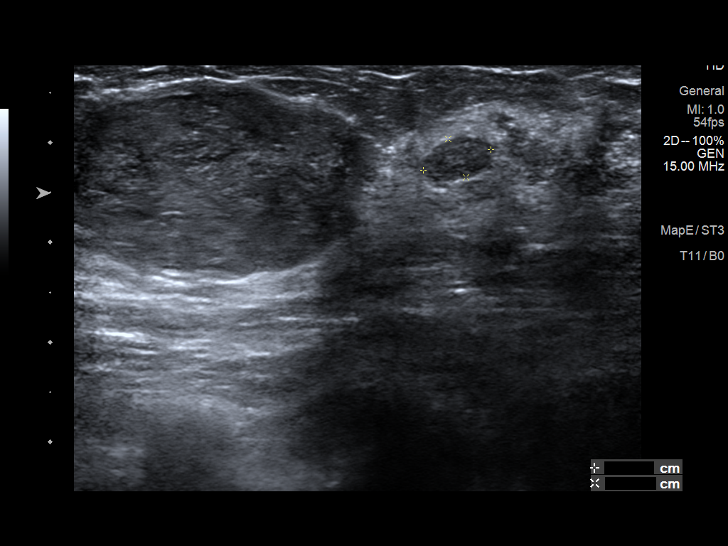
[im 6/11]
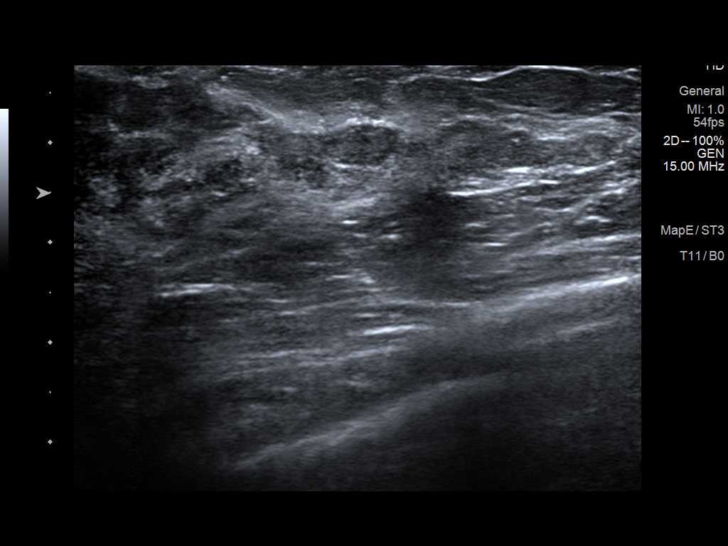
[im 7/11]
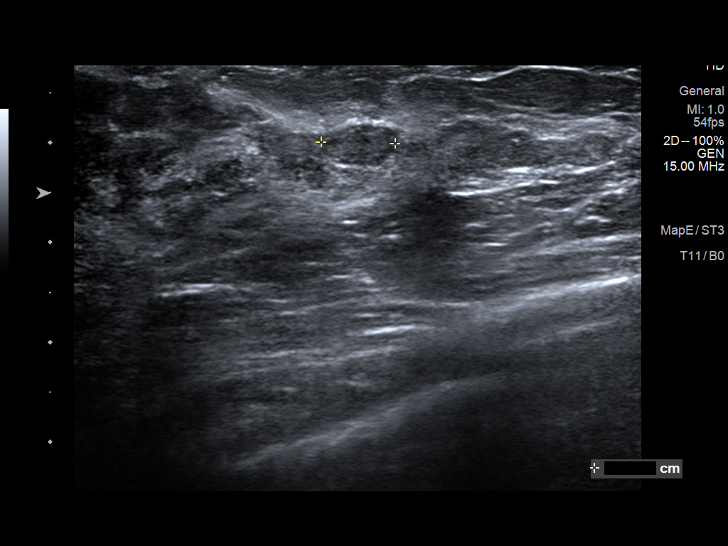
[im 8/11]
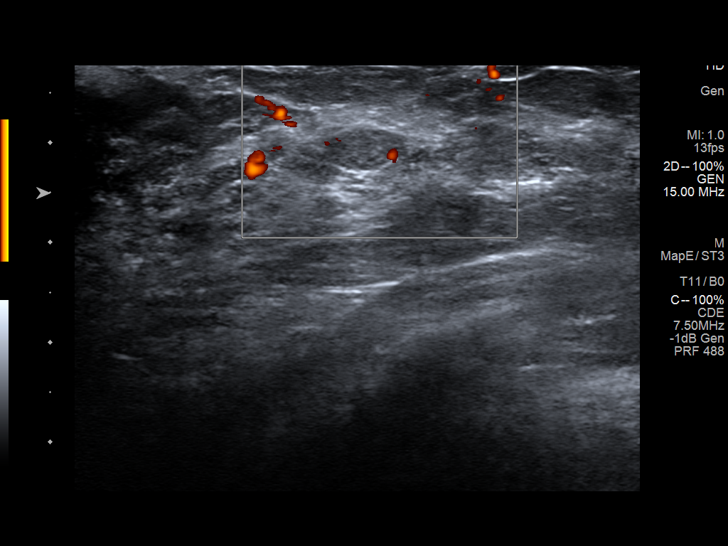
[im 9/11]
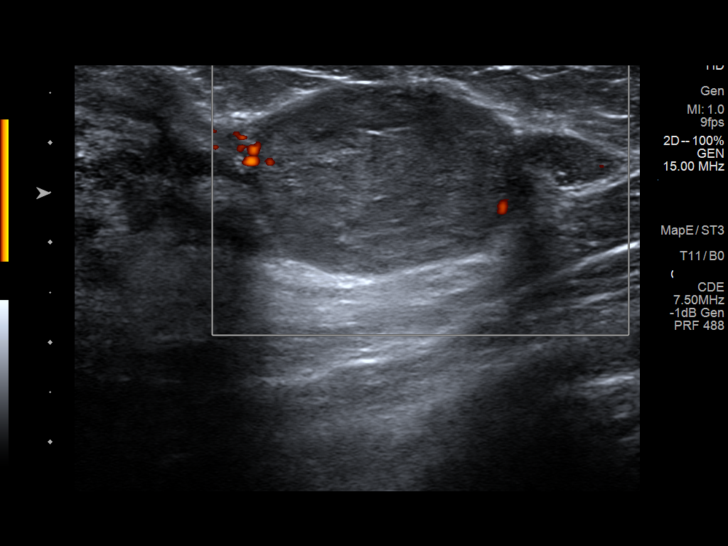
[im 10/11]
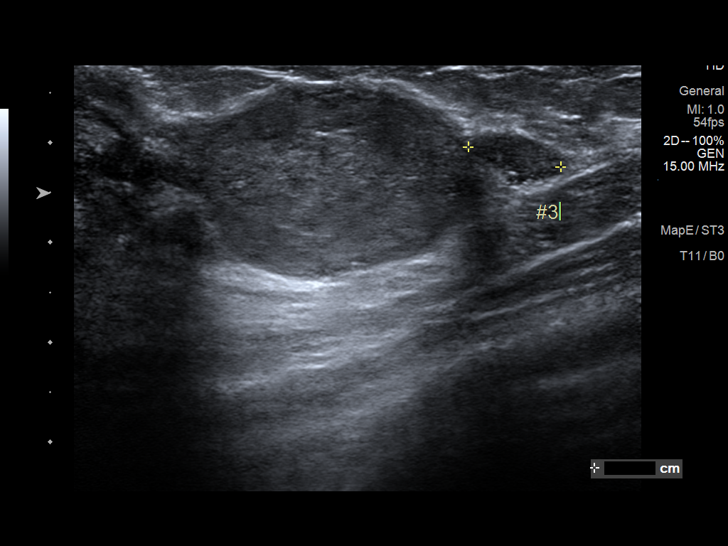
[im 11/11]
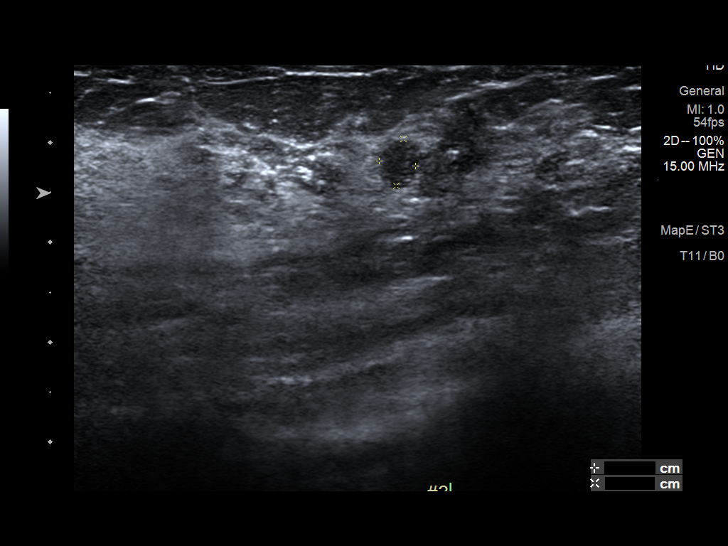

[11 of 11 positions shown; findings below may reference images not displayed]

FINDINGS: Ultrasound of the right breast at 3 o'clock, 3 cm from the nipple
demonstrates a circumscribed oval hypoechoic mass measuring 2.8 x
2.0 x 2.9 cm.

There is a mass immediately adjacent and abutting this larger mass
which measures 1.0 x 0.4 x 0.5 cm (labeled #3).

There is an additional adjacent mass also at 3 o'clock, 3 cm from
the nipple, which measures 0.7 x 0.4 x 0.7 cm.
IMPRESSION: There are 3 probably benign masses in the right breast at 3 o'clock.
These are favored to represent fibroadenomas.

RECOMMENDATION:
Six-month follow-up right breast ultrasound.

I have discussed the findings and recommendations with the patient.
Results were also provided in writing at the conclusion of the
visit. If applicable, a reminder letter will be sent to the patient
regarding the next appointment.

BI-RADS CATEGORY  3: Probably benign.

## 2020-04-01 IMAGING — US ULTRASOUND RIGHT BREAST LIMITED
1 series · 14 of 14 positions shown · non-contrast
Comparison: Previous exam(s).

CLINICAL DATA: Six-month follow-up of right breast masses.

EXAM:
ULTRASOUND OF THE RIGHT BREAST

[Series 1: ultrasound right breast limited · 0.09mm/px · 14 of 14 slices shown]
[im 1/14]
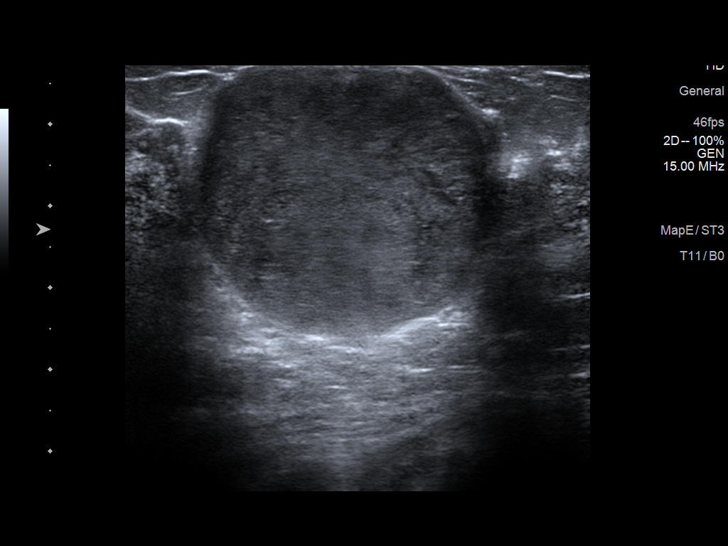
[im 2/14]
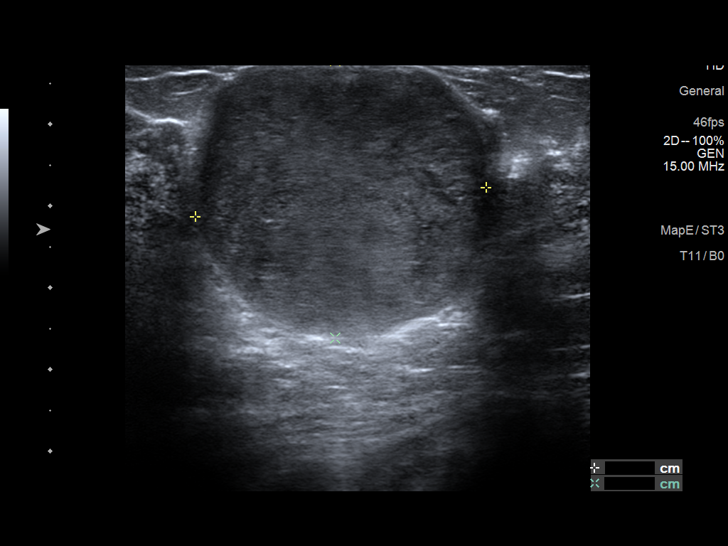
[im 3/14]
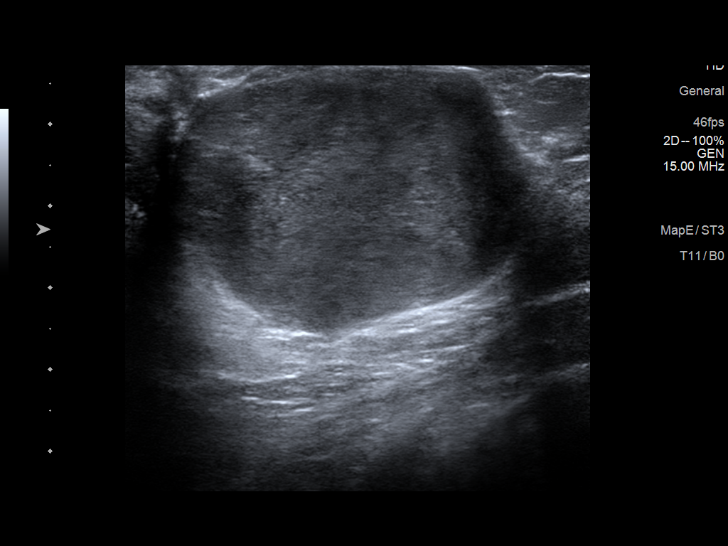
[im 4/14]
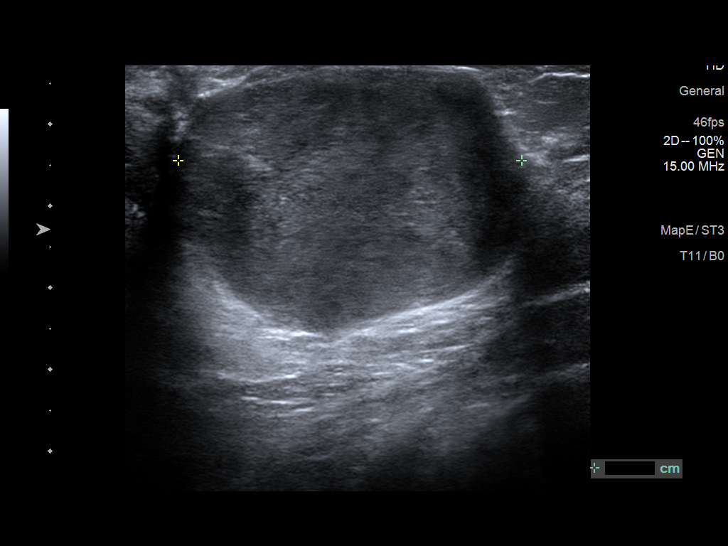
[im 5/14]
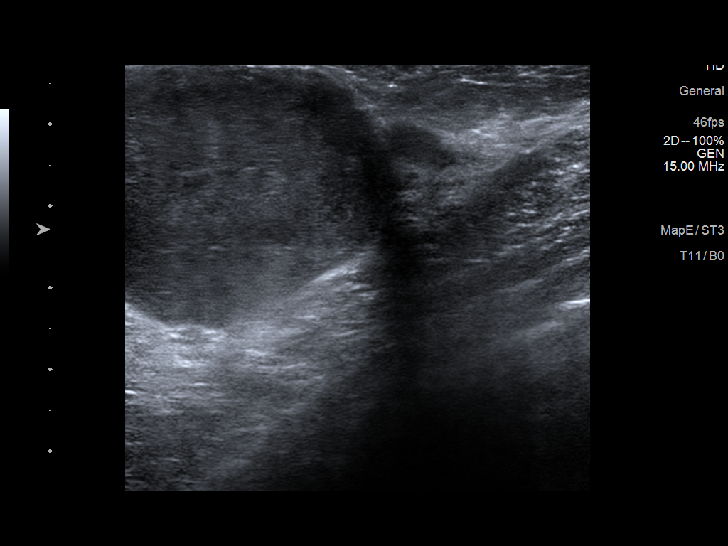
[im 6/14]
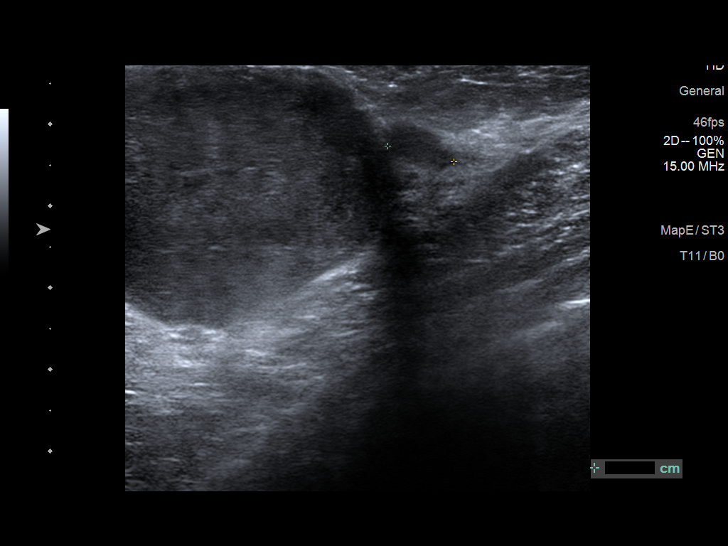
[im 7/14]
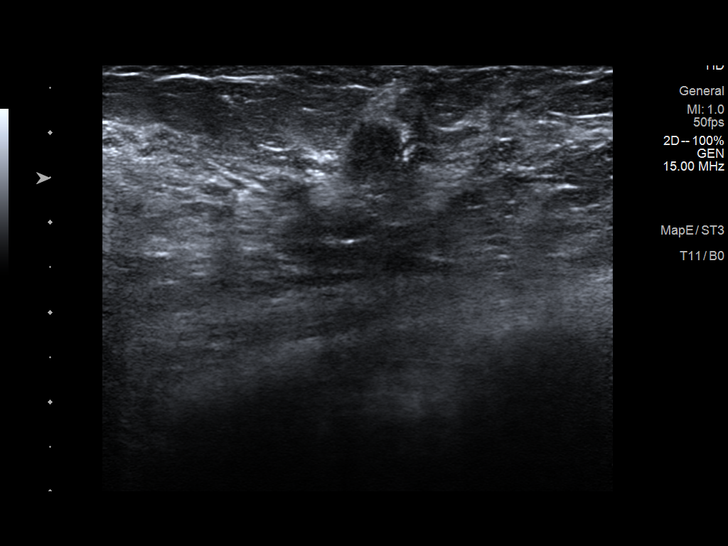
[im 8/14]
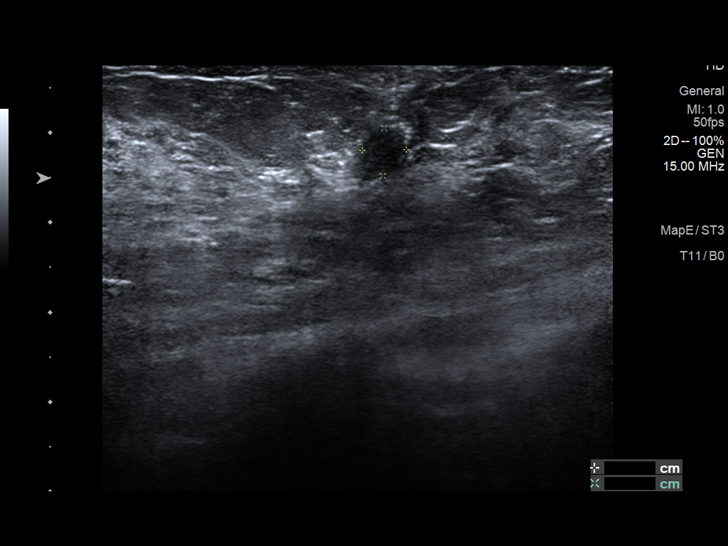
[im 9/14]
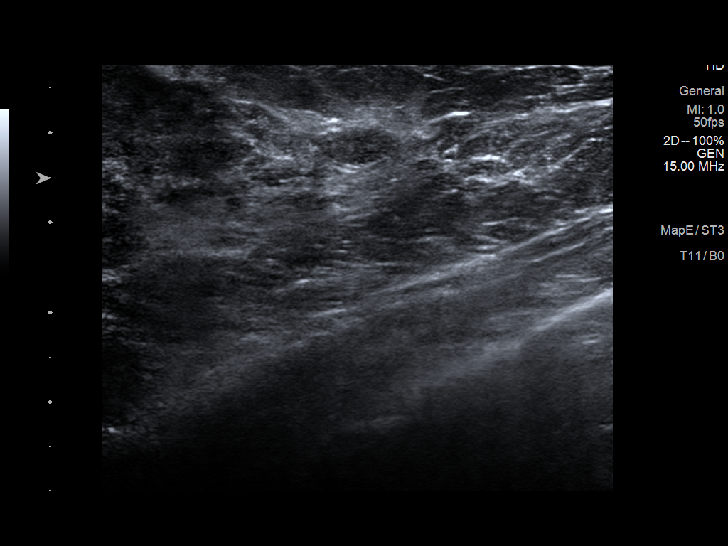
[im 10/14]
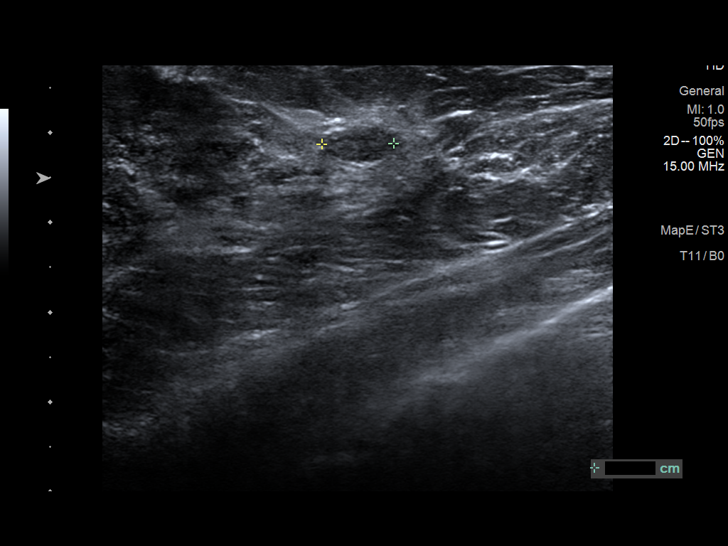
[im 11/14]
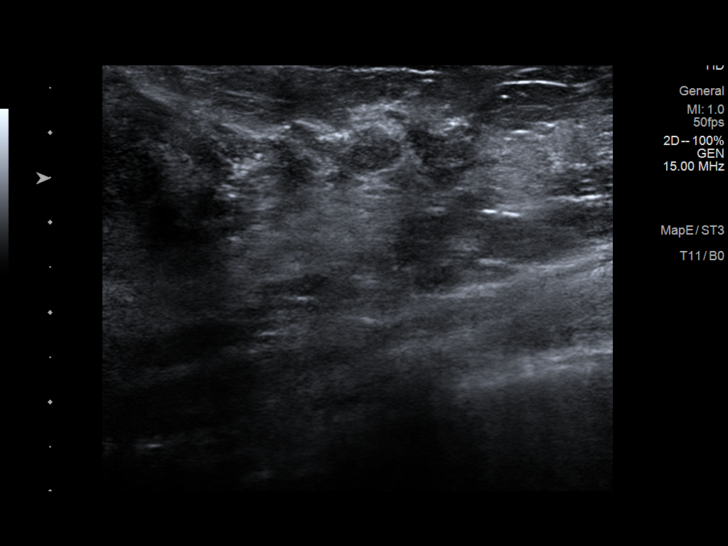
[im 12/14]
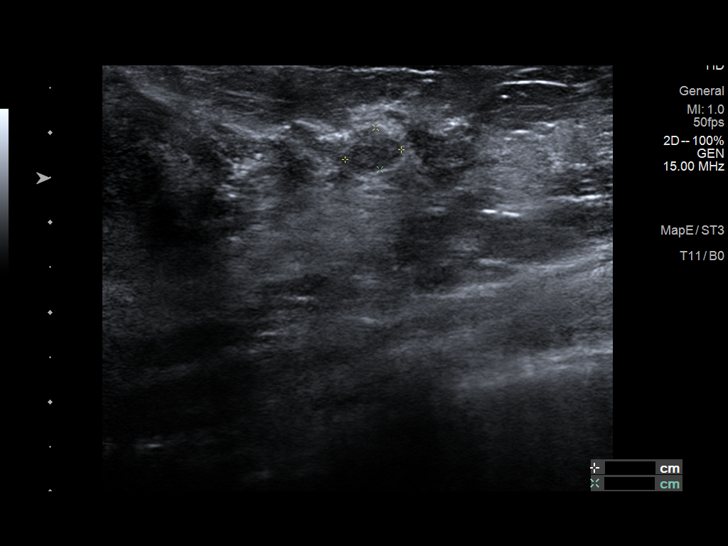
[im 13/14]
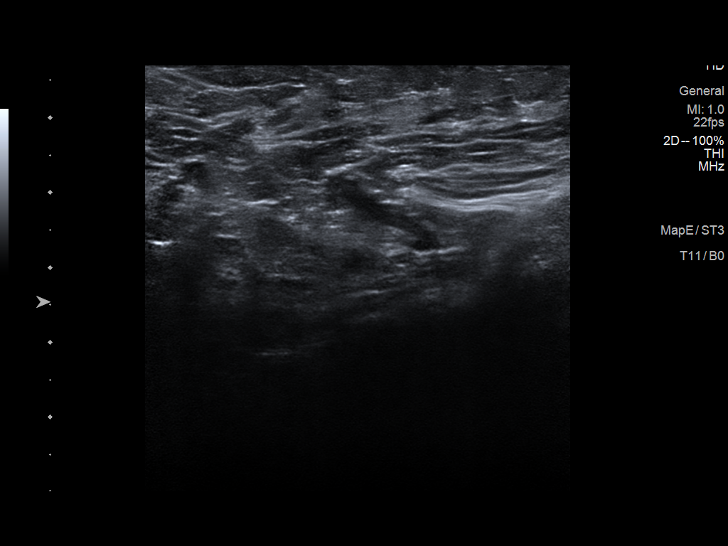
[im 14/14]
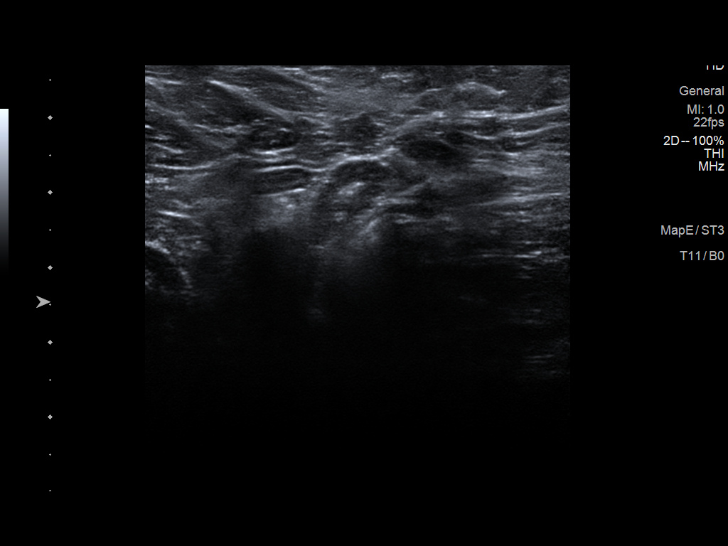

[14 of 14 positions shown; findings below may reference images not displayed]

FINDINGS: On physical exam, multiple lumps are again identified.

Targeted ultrasound is performed, showing a mass at 3 o'clock, 3 cm
from the nipple measuring 3.6 x 3.4 x 4.2 cm today versus 2.8 x
x 2.9 cm previously. An adjacent mass at 3 o'clock, 3 cm from the
nipple measures 8 by 5 x 5 mm today versus 10 by 4 x 5 mm
previously, not significantly changed. A final mass at 3 o'clock, 3
cm from the nipple measures 8 by 6 x 5 mm today versus 7 by 7 x 4 mm
previously, not significantly changed. No axillary adenopathy.
IMPRESSION: The largest mass is grown. The other 2 masses are stable and
probably benign.

RECOMMENDATION:
Recommend biopsy of the largest growing mass in the right breast.

I have discussed the findings and recommendations with the patient.
Results were also provided in writing at the conclusion of the
visit. If applicable, a reminder letter will be sent to the patient
regarding the next appointment.

BI-RADS CATEGORY  4: Suspicious.

## 2020-06-05 IMAGING — DX MM BREAST SURGICAL SPECIMEN
1 series · 2 of 2 positions shown · non-contrast
Comparison: Previous exam(s).

CLINICAL DATA: Radioactive seed localization was performed of a
right breast mass prior to excision.

EXAM:
SPECIMEN RADIOGRAPH OF THE RIGHT BREAST

[Series 2: specimen digital x-ray, derived · right · 2 of 2 slices shown]
[im 1/2]
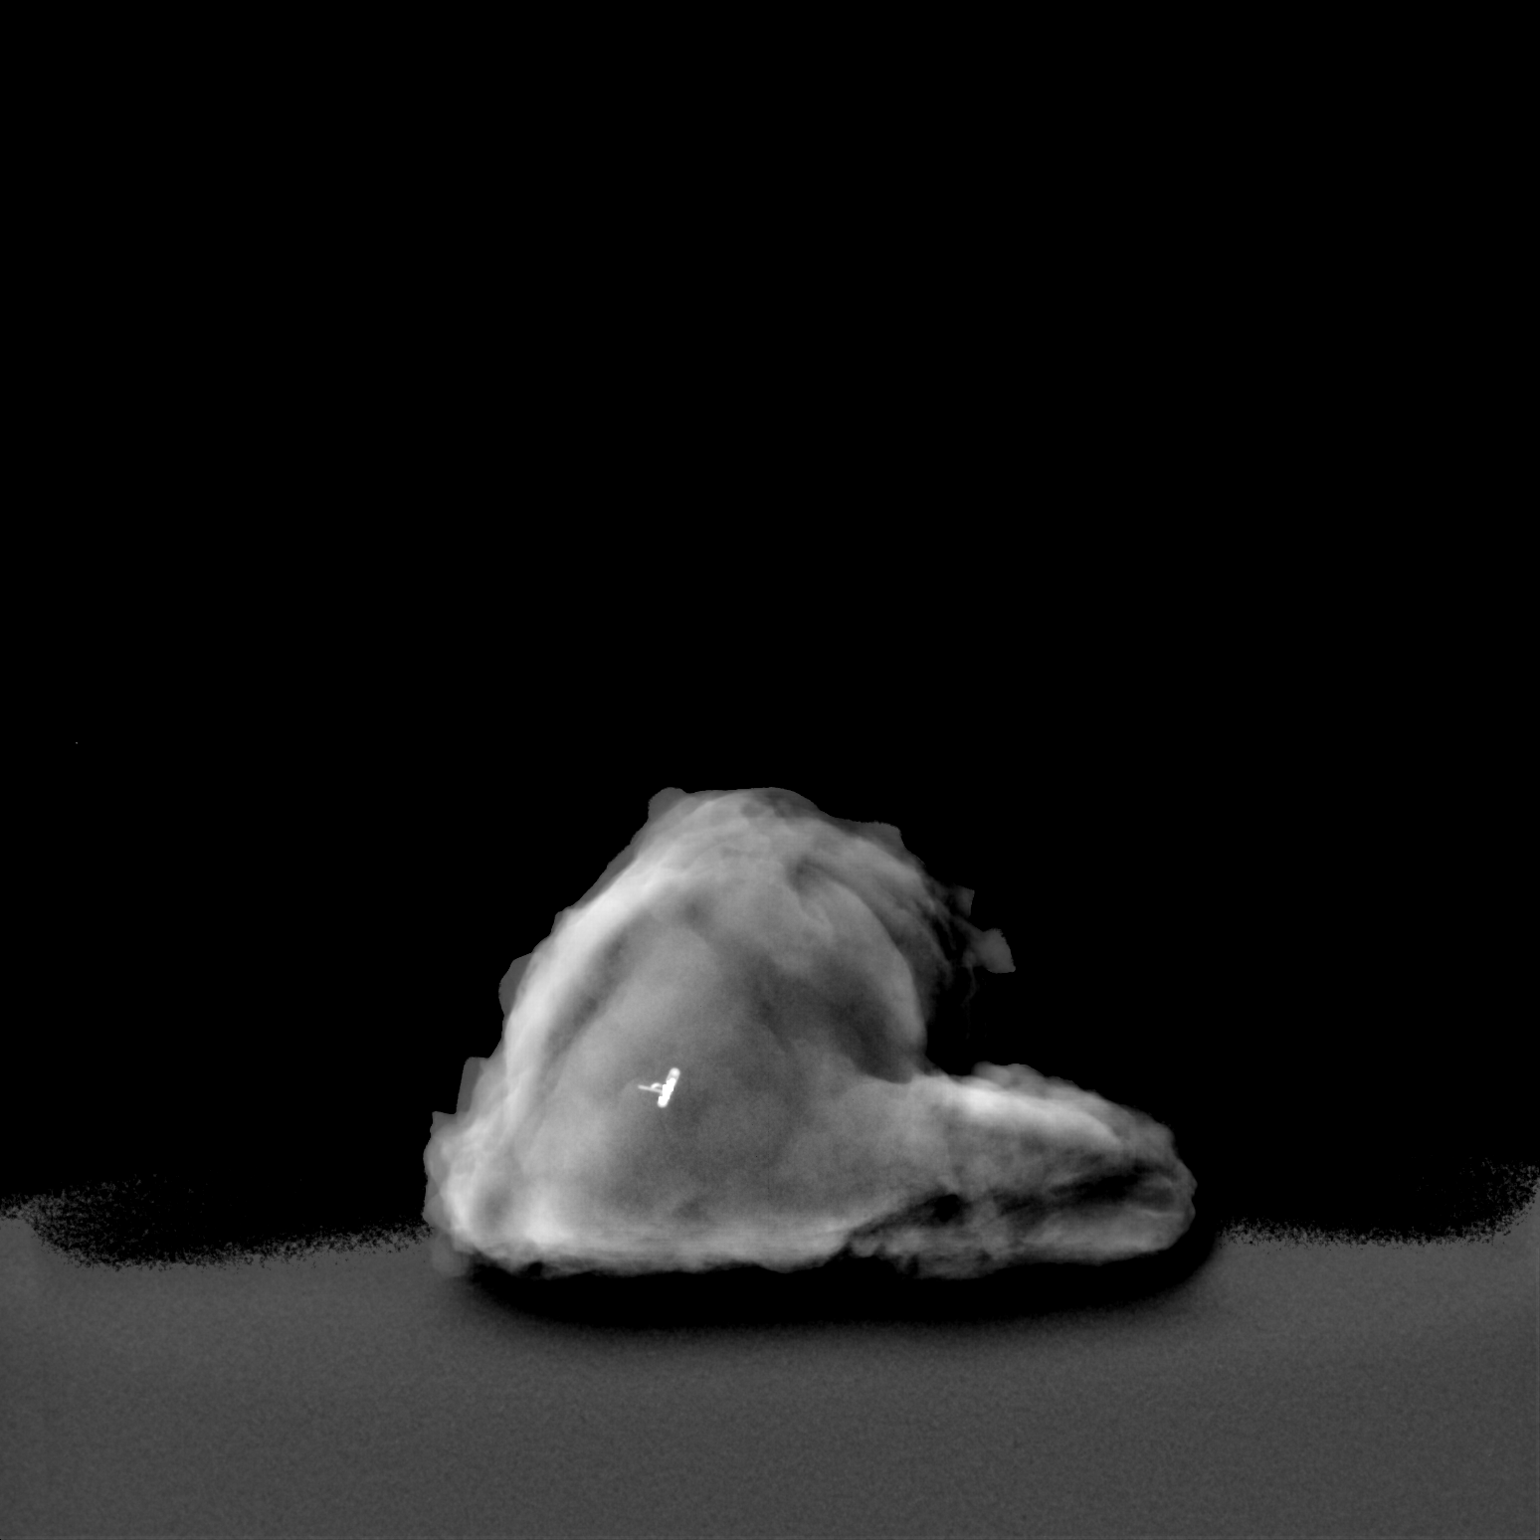
[im 2/2]
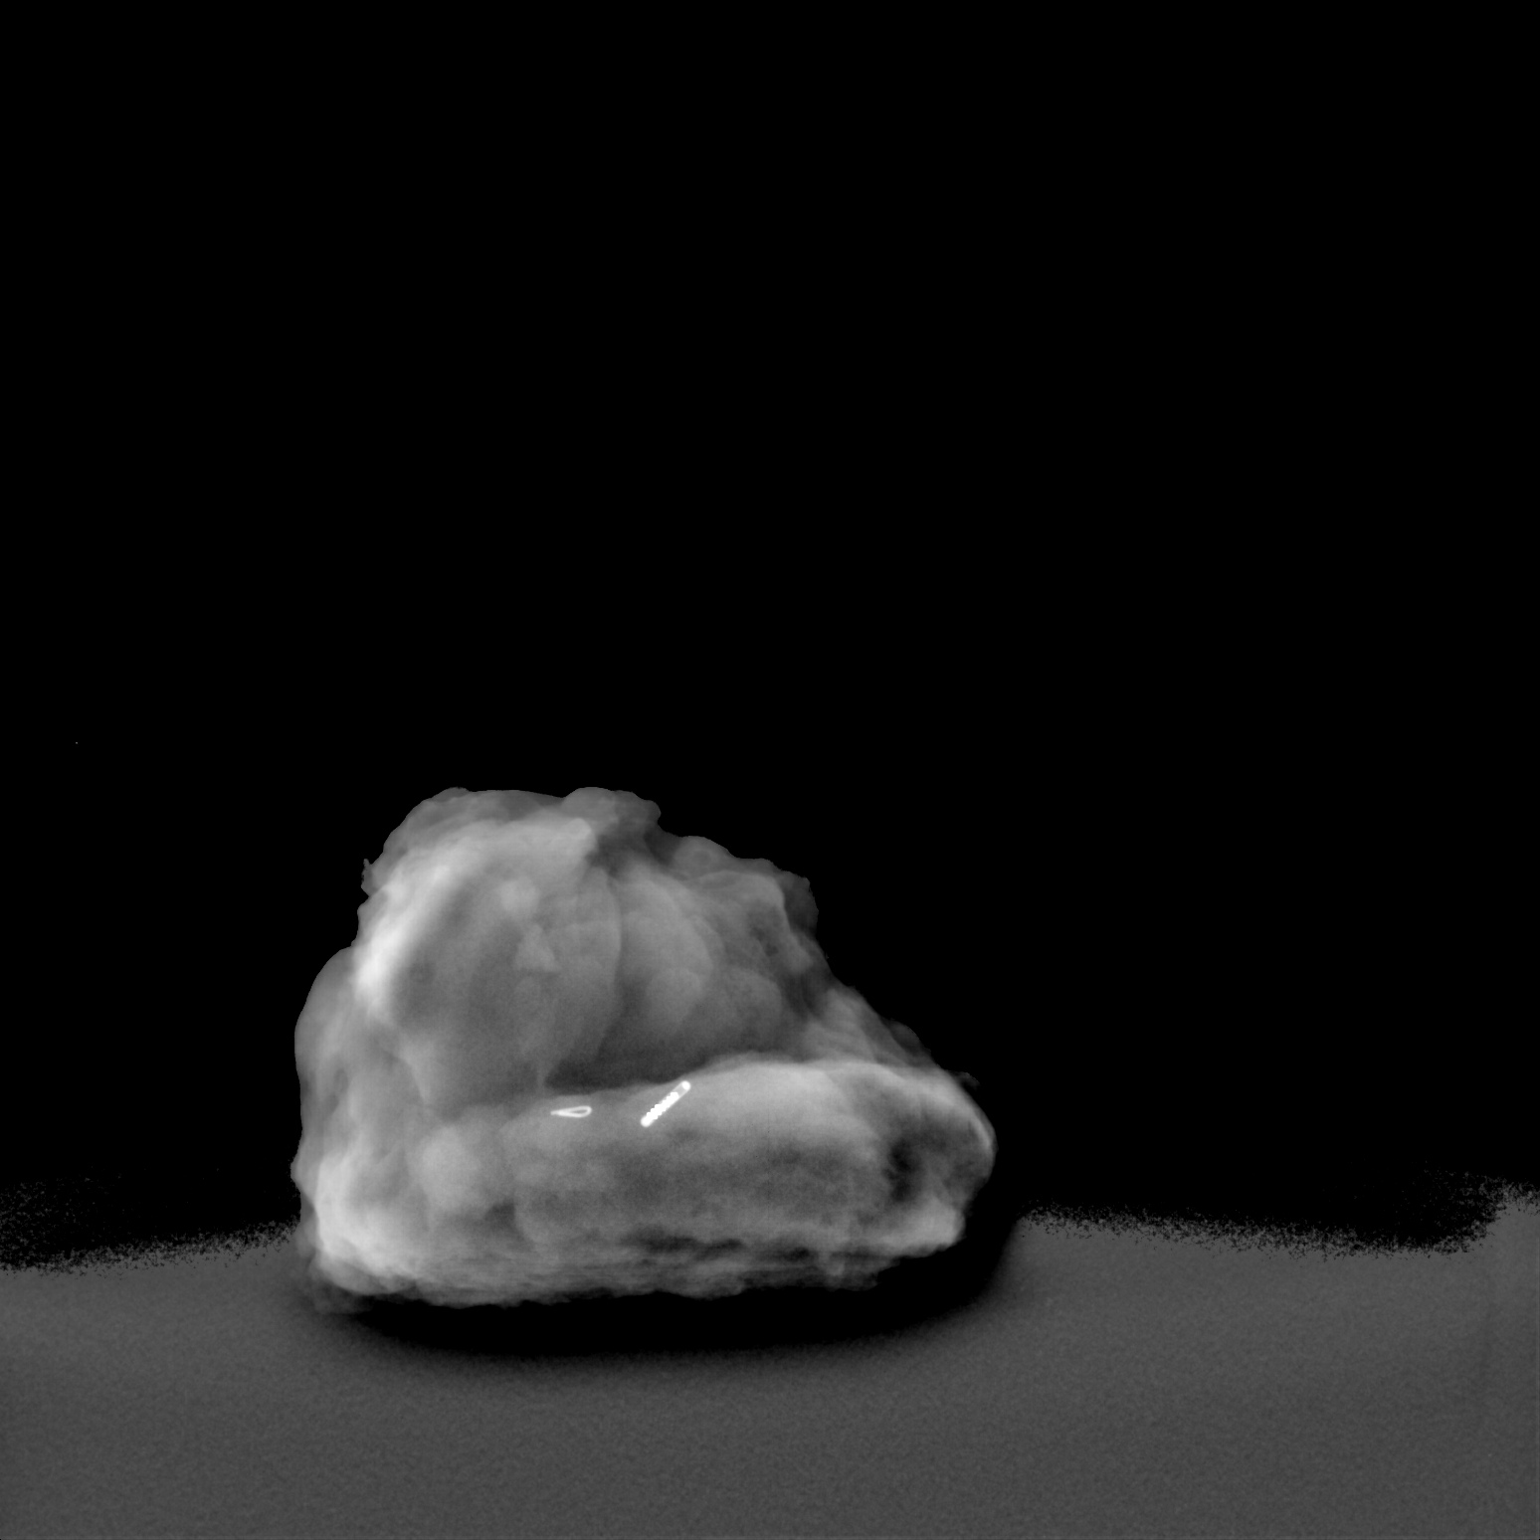

[2 of 2 positions shown; findings below may reference images not displayed]

FINDINGS: Status post excision of the right breast. The radioactive seed and
biopsy marker clip are present and completely intact.
IMPRESSION: Specimen radiograph of the right breast.
